# Patient Record
Sex: Male | Born: 2000 | Race: Black or African American | Hispanic: No | Marital: Single | Smoking: Never smoker
Health system: Southern US, Community
[De-identification: ages and names within clinical notes are randomized; demographics above are authoritative.]

## PROBLEM LIST (undated history)

## (undated) DIAGNOSIS — F902 Attention-deficit hyperactivity disorder, combined type: Secondary | ICD-10-CM

## (undated) DIAGNOSIS — F913 Oppositional defiant disorder: Secondary | ICD-10-CM

## (undated) HISTORY — DX: Attention-deficit hyperactivity disorder, combined type: F90.2

## (undated) HISTORY — DX: Oppositional defiant disorder: F91.3

## (undated) HISTORY — PX: TYMPANOSTOMY TUBE PLACEMENT: SHX32

---
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
## 2020-01-07 ENCOUNTER — Ambulatory Visit: Payer: Self-pay | Attending: Internal Medicine

## 2020-01-07 DIAGNOSIS — Z23 Encounter for immunization: Secondary | ICD-10-CM

## 2020-01-07 NOTE — Progress Notes (Signed)
   Covid-19 Vaccination Clinic  Name:  Matthew Huber    MRN: [id number] DOB: [birth year]  01/07/2020  Matthew Huber was observed post Covid-19 immunization for 30 minutes based on pre-vaccination screening without incident. He was provided with Vaccine Information Sheet and instruction to access the V-Safe system.   Matthew Huber was instructed to call 911 with any severe reactions post vaccine: Marland Kitchen Difficulty breathing  . Swelling of face and throat  . A fast heartbeat  . A bad rash all over body  . Dizziness and weakness   Immunizations Administered    Name Date Dose VIS Date Route   Pfizer COVID-19 Vaccine 01/07/2020  4:11 PM 0.3 mL 07/17/2018 Intramuscular   Manufacturer: [employer], [employer]   Lot: [id number]   NDC: [address]-5

## 2020-01-28 ENCOUNTER — Ambulatory Visit: Payer: Self-pay

## 2020-02-04 ENCOUNTER — Ambulatory Visit: Payer: Self-pay | Attending: Internal Medicine

## 2020-02-04 DIAGNOSIS — Z23 Encounter for immunization: Secondary | ICD-10-CM

## 2020-02-04 NOTE — Progress Notes (Signed)
   Covid-19 Vaccination Clinic  Name:  Matthew Huber    MRN: [id number] DOB: January 14, 2001  02/04/2020  Mr. Matthew Huber was observed post Covid-19 immunization for 15 minutes without incident. He was provided with Vaccine Information Sheet and instruction to access the V-Safe system.   Matthew Huber was instructed to call 911 with any severe reactions post vaccine: Marland Kitchen Difficulty breathing  . Swelling of face and throat  . A fast heartbeat  . A bad rash all over body  . Dizziness and weakness   Immunizations Administered    Name Date Dose VIS Date Route   Pfizer COVID-19 Vaccine 02/04/2020  3:58 PM 0.3 mL 07/17/2018 Intramuscular   Manufacturer: [employer], [employer]   Lot: [id number]   NDC: [id number]
